# Patient Record
Sex: Male | Born: 2000 | Race: White | Hispanic: No | Marital: Single | Smoking: Never smoker
Health system: Southern US, Community
[De-identification: ages and names within clinical notes are randomized; demographics above are authoritative.]

## PROBLEM LIST (undated history)

## (undated) DIAGNOSIS — J302 Other seasonal allergic rhinitis: Secondary | ICD-10-CM

## (undated) HISTORY — PX: TONSILLECTOMY: SUR1361

---
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
## 2022-06-21 ENCOUNTER — Emergency Department (HOSPITAL_BASED_OUTPATIENT_CLINIC_OR_DEPARTMENT_OTHER)
Admission: EM | Admit: 2022-06-21 | Discharge: 2022-06-21 | Disposition: A | Discharge status: Home / Self Care | Attending: Emergency Medicine | Admitting: Emergency Medicine

## 2022-06-21 ENCOUNTER — Emergency Department (HOSPITAL_BASED_OUTPATIENT_CLINIC_OR_DEPARTMENT_OTHER)

## 2022-06-21 ENCOUNTER — Other Ambulatory Visit: Payer: Self-pay

## 2022-06-21 ENCOUNTER — Encounter (HOSPITAL_BASED_OUTPATIENT_CLINIC_OR_DEPARTMENT_OTHER): Payer: Self-pay

## 2022-06-21 DIAGNOSIS — X501XXA Overexertion from prolonged static or awkward postures, initial encounter: Secondary | ICD-10-CM | POA: Diagnosis not present

## 2022-06-21 DIAGNOSIS — Y990 Civilian activity done for income or pay: Secondary | ICD-10-CM | POA: Diagnosis not present

## 2022-06-21 DIAGNOSIS — S63502A Unspecified sprain of left wrist, initial encounter: Secondary | ICD-10-CM | POA: Insufficient documentation

## 2022-06-21 DIAGNOSIS — S6992XA Unspecified injury of left wrist, hand and finger(s), initial encounter: Secondary | ICD-10-CM | POA: Diagnosis present

## 2022-06-21 NOTE — ED Notes (Signed)
ED Provider at bedside. 

## 2022-06-21 NOTE — ED Triage Notes (Addendum)
Patient here POV from Home.  Endorses injuring his Left Wrist Thursday. Seeks Evaluation as the pain has worsened. No hand pain. Throbbing in [place].   Also requests a Drug Screening for Work.  NAD Noted during Triage. A&Ox4. GCS 15. Ambulatory.

## 2022-06-21 NOTE — ED Notes (Signed)
RN reviewed discharge instructions with pt. Pt verbalized understanding and had no further questions. VSS upon discharge.  

## 2022-06-21 NOTE — Discharge Instructions (Signed)
You were seen in the emergency department for continued left wrist pain.  Your x-ray did not show any obvious fracture.  Please continue to use your splint and follow-up with hand orthopedist Dr. Greta Doom.  Tylenol and ibuprofen for pain.

## 2022-06-21 NOTE — ED Provider Notes (Signed)
[facility] Provider Note   CSN: [id number] Arrival date & time: 06/21/22  1907     History {Add pertinent medical, surgical, social history, OB history to HPI:1} Chief Complaint  Patient presents with   Wrist Injury    Gregg Brown is a 22 y.o. male.  He is right-hand dominant.  Complaining of pain of his left wrist since he injured it at work 4 days ago.  He said he was manually turning a roller when he experienced pain on the medial aspect of the wrist.  He rested over the weekend but went back to work today and experienced the pain and so they sent him here for evaluation.  No numbness.  No prior history of wrist problems.  The history is provided by the patient.  Wrist Injury Location:  Wrist Wrist location:  L wrist Injury: yes   Time since incident:  4 days Pain details:    Quality:  Shooting   Radiates to:  L forearm   Severity:  Moderate   Onset quality:  Sudden   Duration:  4 days   Progression:  Unchanged Handedness:  Right-handed Dislocation: no   Foreign body present:  No foreign bodies Prior injury to area:  No Relieved by:  Rest Worsened by:  Movement Ineffective treatments:  None tried Associated symptoms: no fever, no muscle weakness, no numbness and no tingling        Home Medications Prior to Admission medications   Medication Sig Start Date End Date Taking? Authorizing Provider  acetaminophen (TYLENOL) 325 MG tablet Take 2 tablets (650 mg total) by mouth every 6 (six) hours as needed. Patient not taking: Reported on 06/29/2020 01/12/18   Jean Rosenthal, NP  ibuprofen (ADVIL,MOTRIN) 400 MG tablet Take 1 tablet (400 mg total) by mouth every 6 (six) hours as needed. Patient not taking: Reported on 06/29/2020 11/10/17   Debara Pickett C, PA-C      Allergies    Patient has no known allergies.    Review of Systems   Review of Systems  Constitutional:  Negative for fever.  Skin:  Negative for rash and  wound.  Neurological:  Negative for weakness and numbness.    Physical Exam Updated Vital Signs BP (!) 162/84 (BP Location: Right Arm)   Pulse 80   Temp 97.8 F (36.6 C)   Resp 18   Ht '6\' 2"'$  (1.88 m)   Wt 81.6 kg   SpO2 100%   BMI 23.11 kg/m  Physical Exam Vitals and nursing note reviewed.  Constitutional:      Appearance: Normal appearance. He is well-developed.  HENT:     Head: Normocephalic and atraumatic.  Eyes:     Conjunctiva/sclera: Conjunctivae normal.  Pulmonary:     Effort: Pulmonary effort is normal.  Musculoskeletal:        General: Tenderness present. No deformity.     Cervical back: Neck supple.     Comments: Left upper extremity nontender shoulder elbow hand.  He has some tenderness of his medial wrist on the ulnar aspect.  There is no significant swelling or erythema.  No open wounds.  No snuffbox tenderness.  No distal numbness or weakness.  Cap refill brisk radial pulse 2+  Skin:    General: Skin is warm and dry.     Capillary Refill: Capillary refill takes less than 2 seconds.     Findings: No bruising or erythema.  Neurological:     General: No  focal deficit present.     Mental Status: He is alert.     GCS: GCS eye subscore is 4. GCS verbal subscore is 5. GCS motor subscore is 6.     Sensory: No sensory deficit.     Motor: No weakness.     ED Results / Procedures / Treatments   Labs (all labs ordered are listed, but only abnormal results are displayed) Labs Reviewed - No data to display  EKG None  Radiology No results found.  Procedures Procedures  {Document cardiac monitor, telemetry assessment procedure when appropriate:1}  Medications Ordered in ED Medications - No data to display  ED Course/ Medical Decision Making/ A&P   {   Click here for ABCD2, HEART and other calculatorsREFRESH Note before signing :1}                          Medical Decision Making Amount and/or Complexity of Data Reviewed Radiology: ordered.   This  patient complains of ***; this involves an extensive number of treatment Options and is a complaint that carries with it a high risk of complications and morbidity. The differential includes ***  I ordered, reviewed and interpreted labs, which included *** I ordered medication *** and reviewed PMP when indicated. I ordered imaging studies which included *** and I independently    visualized and interpreted imaging which showed *** Additional history obtained from *** Previous records obtained and reviewed *** I consulted *** and discussed lab and imaging findings and discussed disposition.  Cardiac monitoring reviewed, *** Social determinants considered, *** Critical Interventions: ***  After the interventions stated above, I reevaluated the patient and found *** Admission and further testing considered, ***   {Document critical care time when appropriate:1} {Document review of labs and clinical decision tools ie heart score, Chads2Vasc2 etc:1}  {Document your independent review of radiology images, and any outside records:1} {Document your discussion with family members, caretakers, and with consultants:1} {Document social determinants of health affecting pt's care:1} {Document your decision making why or why not admission, treatments were needed:1} Final Clinical Impression(s) / ED Diagnoses Final diagnoses:  None    Rx / DC Orders ED Discharge Orders     None
